# Patient Record
Sex: Male | Born: 2005 | Hispanic: Yes | Marital: Single
Health system: Southern US, Community
[De-identification: ages and names within clinical notes are randomized; demographics above are authoritative.]

## PROBLEM LIST (undated history)

## (undated) DIAGNOSIS — J45909 Unspecified asthma, uncomplicated: Secondary | ICD-10-CM

---
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
## 2024-02-03 ENCOUNTER — Emergency Department (HOSPITAL_COMMUNITY)
Admission: EM | Admit: 2024-02-03 | Discharge: 2024-02-03 | Disposition: A | Discharge status: Home / Self Care | Attending: Emergency Medicine | Admitting: Emergency Medicine

## 2024-02-03 DIAGNOSIS — T7840XA Allergy, unspecified, initial encounter: Secondary | ICD-10-CM | POA: Insufficient documentation

## 2024-02-03 DIAGNOSIS — Z91010 Allergy to peanuts: Secondary | ICD-10-CM | POA: Diagnosis not present

## 2024-02-03 DIAGNOSIS — J45909 Unspecified asthma, uncomplicated: Secondary | ICD-10-CM | POA: Diagnosis not present

## 2024-02-03 MED ORDER — EPINEPHRINE 0.3 MG/0.3ML IJ SOAJ: 0.3000 mg | INTRAMUSCULAR | 0 refills | Status: AC | PRN

## 2024-02-03 NOTE — ED Provider Notes (Signed)
 [place] Provider Note  CSN: [id number] Arrival date & time: 02/03/24 1754  Chief Complaint(s) Allergic Reaction  HPI Barry Sanders is a 18 y.o. male who is here today for an allergic reaction.  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  Called EMS who provided him with Benadryl and IM epinephrine.   Past Medical History Past Medical History:  Diagnosis Date   Asthma    There are no active problems to display for this patient.  Home Medication(s) Prior to Admission medications   Medication Sig Start Date End Date Taking? Authorizing Provider  EPINEPHrine 0.3 mg/0.3 mL IJ SOAJ injection Inject 0.3 mg into the muscle as needed for anaphylaxis. 02/03/24  Yes Mannie Pac T, DO  cetirizine  (ZYRTEC ) 10 MG tablet Take 1 tablet (10 mg total) by mouth daily. One tab daily for allergies 07/06/12   Vincente Lynwood BIRCH, MD  cyclobenzaprine  (FLEXERIL ) 5 MG tablet Take 1 tablet (5 mg total) by mouth at bedtime as needed for muscle spasms. 03/24/19   Christopher Savannah, PA-C  ibuprofen  (ADVIL ) 600 MG tablet Take 1 tablet (600 mg total) by mouth every 8 (eight) hours as needed for moderate pain. 03/24/19   Christopher Savannah, PA-C                                                                                                                                    Past Surgical History No past surgical history on file. Family History Family History  Problem Relation Age of Onset   Healthy Mother    Healthy Father     Social History Social History   Substance Use Topics   Alcohol use: No   Drug use: No   Allergies Soybean-containing drug products and Peanut-containing drug products  Review of Systems Review of Systems  Physical Exam Vital Signs  I have reviewed the triage vital signs BP (!) 102/50   Pulse 89   Temp 97.7  F (36.5 C) (Oral)   Resp 12   SpO2 96%   Physical Exam Vitals and nursing note reviewed.  Constitutional:      Appearance: He is not toxic-appearing.  HENT:     Nose: Nose normal.     Mouth/Throat:     Mouth: Mucous membranes are moist.     Pharynx: No posterior oropharyngeal erythema.  Eyes:     Pupils: Pupils are equal, round, and reactive to light.  Pulmonary:     Effort: Pulmonary effort is normal.     Breath sounds: No wheezing.  Abdominal:     General: Abdomen is flat.     Palpations: Abdomen is soft.  Musculoskeletal:        General: Normal range of motion.     Cervical back: Normal range of  motion.  Skin:    General: Skin is warm.     Findings: No rash.  Neurological:     General: No focal deficit present.     Mental Status: He is alert.     ED Results and Treatments Labs (all labs ordered are listed, but only abnormal results are displayed) Labs Reviewed - No data to display                                                                                                                        Radiology No results found.  Pertinent labs & imaging results that were available during my care of the patient were reviewed by me and considered in my medical decision making (see MDM for details).  Medications Ordered in ED Medications - No data to display                                                                                                                                   Procedures Procedures  (including critical care time)  Medical Decision Making / ED Course   This patient presents to the ED for concern of allergic reaction, this involves an extensive number of treatment options, and is a complaint that carries with it a high risk of complications and morbidity.  The differential diagnosis includes anaphylaxis, allergic reaction.  MDM: Patient symptoms were appropriately treated by EMS, currently with normal vital signs, no evidence of airway  involvement.  No swelling of the lips or face, no rash present.  Will monitor the patient here in the ED for 3 hours since his epinephrine treatment.  No indication for labs.  Reassessment 9 PM-patient has completed his 3-hour observation.  No symptoms at this time.  Will discharge with prescription for EpiPen.  Additional history obtained: -Additional history obtained from EMS -External records from outside source obtained and reviewed including: Chart review including previous notes, labs, imaging, consultation notes   Lab Tests: -I ordered, reviewed, and interpreted labs.   The pertinent results include:   Labs Reviewed - No data to display    Medicines ordered and prescription drug management: Meds ordered this encounter  Medications   EPINEPHrine 0.3 mg/0.3 mL IJ SOAJ injection    Sig: Inject 0.3 mg into the muscle as needed for anaphylaxis.    Dispense:  1 each    Refill:  0    -I have reviewed the  patients home medicines and have made adjustments as needed  Cardiac Monitoring: The patient was maintained on a cardiac monitor.  I personally viewed and interpreted the cardiac monitored which showed an underlying rhythm of: Normal sinus rhythm  Social Determinants of Health:  Factors impacting patients care include: Lack of access to primary care   Reevaluation: After the interventions noted above, I reevaluated the patient and found that they have :improved  Co morbidities that complicate the patient evaluation  Past Medical History:  Diagnosis Date   Asthma       Dispostion: I considered admission for this patient, however and symptoms have resolved and he is appropriate for discharge.     Final Clinical Impression(s) / ED Diagnoses Final diagnoses:  Allergic reaction, initial encounter     @PCDICTATION @    Mannie Pac T, DO 02/03/24 2106

## 2024-02-03 NOTE — ED Triage Notes (Signed)
 Pt BIBA from home for allergic reaction to soymilk. 50mg  benadryl IM, en route having some nausea and chest heaviness, then got .3mg  epi, 4mg  zofran. Chest and nausea have mostly resolved. 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  20g lac   110/60 Hr 90s

## 2024-02-03 NOTE — Discharge Instructions (Addendum)
 While you were in the emergency room, you were observed for any recurrence of your allergic reaction.  I have sent a prescription for an EpiPen to your pharmacy.  Please pick this up from the pharmacy, and use it if you begin to feel shortness of breath or swelling in your mouth or tongue.  If you experience of symptoms, return to the emergency room.  Try to avoid things that contain soy or peanuts.  Follow-up with your primary care doctor.
